# Patient Record
Sex: Female | Born: 2000 | Race: White | Hispanic: No | Marital: Single | Smoking: Never smoker
Health system: Southern US, Community
[De-identification: ages and names within clinical notes are randomized; demographics above are authoritative.]

## PROBLEM LIST (undated history)

## (undated) ENCOUNTER — Ambulatory Visit: Admission: EM

## (undated) DIAGNOSIS — E042 Nontoxic multinodular goiter: Secondary | ICD-10-CM

## (undated) HISTORY — PX: NO PAST SURGERIES: SHX2092

---
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
## 2021-03-30 ENCOUNTER — Ambulatory Visit: Payer: Self-pay | Admitting: Dermatology

## 2021-09-02 ENCOUNTER — Other Ambulatory Visit: Payer: Self-pay | Admitting: Obstetrics and Gynecology

## 2021-09-02 DIAGNOSIS — L700 Acne vulgaris: Secondary | ICD-10-CM

## 2021-09-02 DIAGNOSIS — Z3041 Encounter for surveillance of contraceptive pills: Secondary | ICD-10-CM

## 2021-09-28 NOTE — Progress Notes (Signed)
PCP:  Marina Goodell, MD   Chief Complaint  Patient presents with   Gynecologic Exam    No concerns     HPI:      Ms. Savannah Mcbride is a 20 y.o. G0P0000 whose LMP was Patient's last menstrual period was 09/08/2021 (approximate)., presents today for [facility] f/u.  Her menses are regular every 28-30 days, lasting 5 days.  Dysmenorrhea mild, occurring first 1-2 days of flow. She does not have intermenstrual bleeding. Started on lomedia OCPs in 2018 for menometrorrhagia with sx improvement. Had persistent acne and changed to Yaz, doing well. On accutane now  Sex activity: currently sexually active--conception OCPs Last Pap: N/A due to age Hx of STDs: none  There is no FH of breast cancer. There is no FH of ovarian cancer.  Pt has now occas sharp, fleeting ROUQ pains since last yr. Sx improved with d/c'd caffeine use, but still happens occas. No masses, erythema, trauma.   Tobacco use: The patient denies current or previous tobacco use. Alcohol use: none No drug use.  Exercise: moderately active  She does get adequate calcium but not Vitamin D in her diet. Gardasil not done.   Past Medical History:  Diagnosis Date   Multiple thyroid nodules 10/2017    Past Surgical History:  Procedure Laterality Date   NO PAST SURGERIES      Family History  Problem Relation Age of Onset   AAA (abdominal aortic aneurysm) Maternal Grandfather 59   Colon cancer Other    Breast cancer Neg Hx    Ovarian cancer Neg Hx    Deep vein thrombosis Neg Hx     Social History   Socioeconomic History   Marital status: Single    Spouse name: Not on file   Number of children: Not on file   Years of education: Not on file   Highest education level: Not on file  Occupational History   Not on file  Tobacco Use   Smoking status: Never   Smokeless tobacco: Never  Vaping Use   Vaping Use: Never used  Substance and Sexual Activity   Alcohol use: No   Drug use: No   Sexual activity: Yes    Birth  control/protection: Pill, Condom  Other Topics Concern   Not on file  Social History Narrative   Not on file   Social Determinants of Health   Financial Resource Strain: Not on file  Food Insecurity: Not on file  Transportation Needs: Not on file  Physical Activity: Not on file  Stress: Not on file  Social Connections: Not on file  Intimate Partner Violence: Not on file     Current Outpatient Medications:    ibuprofen (ADVIL,MOTRIN) 200 MG tablet, Take by mouth., Disp: , Rfl:    drospirenone-ethinyl estradiol (YAZ) 3-0.02 MG tablet, Take 1 tablet by mouth daily., Disp: 84 tablet, Rfl: 3     ROS:  Review of Systems  Constitutional:  Negative for fatigue, fever and unexpected weight change.  Respiratory:  Negative for cough, shortness of breath and wheezing.   Cardiovascular:  Negative for chest pain, palpitations and leg swelling.  Gastrointestinal:  Negative for blood in stool, constipation, diarrhea, nausea and vomiting.  Endocrine: Negative for cold intolerance, heat intolerance and polyuria.  Genitourinary:  Negative for dyspareunia, dysuria, flank pain, frequency, genital sores, hematuria, menstrual problem, pelvic pain, urgency, vaginal bleeding, vaginal discharge and vaginal pain.  Musculoskeletal:  Negative for back pain, joint swelling and myalgias.  Skin:  Negative  for rash.  Neurological:  Negative for dizziness, syncope, light-headedness, numbness and headaches.  Hematological:  Negative for adenopathy.  Psychiatric/Behavioral:  Negative for agitation, confusion, sleep disturbance and suicidal ideas. The patient is not nervous/anxious.   BREAST:  pain   Objective: BP 100/70   Ht 5\' 6"  (1.676 m)   Wt 160 lb (72.6 kg)   LMP 09/08/2021 (Approximate)   BMI 25.82 kg/m    Physical Exam Constitutional:      Appearance: She is well-developed.  Genitourinary:     Vulva normal.     Right Labia: No rash, tenderness or lesions.    Left Labia: No tenderness,  lesions or rash.    No vaginal discharge, erythema or tenderness.      Right Adnexa: not tender and no mass present.    Left Adnexa: not tender and no mass present.    No cervical friability or polyp.     Uterus is not enlarged or tender.  Breasts:    Right: No mass, nipple discharge, skin change or tenderness.     Left: No mass, nipple discharge, skin change or tenderness.  Neck:     Thyroid: No thyromegaly.  Cardiovascular:     Rate and Rhythm: Normal rate and regular rhythm.     Heart sounds: Normal heart sounds. No murmur heard. Pulmonary:     Effort: Pulmonary effort is normal.     Breath sounds: Normal breath sounds.  Abdominal:     Palpations: Abdomen is soft.     Tenderness: There is no abdominal tenderness. There is no guarding or rebound.  Musculoskeletal:        General: Normal range of motion.     Cervical back: Normal range of motion.  Lymphadenopathy:     Cervical: No cervical adenopathy.  Neurological:     General: No focal deficit present.     Mental Status: She is alert and oriented to person, place, and time.     Cranial Nerves: No cranial nerve deficit.  Skin:    General: Skin is warm and dry.  Psychiatric:        Mood and Affect: Mood normal.        Behavior: Behavior normal.        Thought Content: Thought content normal.        Judgment: Judgment normal.  Vitals reviewed.    Assessment/Plan: Encounter for annual routine gynecological examination  Screening for STD (sexually transmitted disease) - Plan: Cervicovaginal ancillary only  Encounter for surveillance of contraceptive pills - Plan: drospirenone-ethinyl estradiol (YAZ) 3-0.02 MG tablet; OCP RF  Acne vulgaris - Plan: drospirenone-ethinyl estradiol (YAZ) 3-0.02 MG tablet   Meds ordered this encounter  Medications   drospirenone-ethinyl estradiol (YAZ) 3-0.02 MG tablet    Sig: Take 1 tablet by mouth daily.    Dispense:  84 tablet    Refill:  3    Order Specific Question:   Supervising  Provider    Answer:   Gae Dry [id number]              GYN counsel adequate intake of calcium and vitamin D, diet and exercise     F/U  Return in about 1 year (around 09/29/2022).  Deziree Mokry B. Jasmina Gendron, PA-C 09/29/2021 11:25 AM

## 2021-09-29 ENCOUNTER — Encounter: Payer: Self-pay | Admitting: Obstetrics and Gynecology

## 2021-09-29 ENCOUNTER — Other Ambulatory Visit: Payer: Self-pay

## 2021-09-29 ENCOUNTER — Ambulatory Visit (INDEPENDENT_AMBULATORY_CARE_PROVIDER_SITE_OTHER): Payer: 59 | Admitting: Obstetrics and Gynecology

## 2021-09-29 ENCOUNTER — Other Ambulatory Visit (HOSPITAL_COMMUNITY)
Admission: RE | Admit: 2021-09-29 | Discharge: 2021-09-29 | Disposition: A | Discharge status: Home / Self Care | Payer: 59 | Source: Ambulatory Visit | Attending: Obstetrics and Gynecology | Admitting: Obstetrics and Gynecology

## 2021-09-29 VITALS — BP 100/70 | Ht 66.0 in | Wt 160.0 lb

## 2021-09-29 DIAGNOSIS — L700 Acne vulgaris: Secondary | ICD-10-CM | POA: Diagnosis not present

## 2021-09-29 DIAGNOSIS — Z01419 Encounter for gynecological examination (general) (routine) without abnormal findings: Secondary | ICD-10-CM

## 2021-09-29 DIAGNOSIS — Z113 Encounter for screening for infections with a predominantly sexual mode of transmission: Secondary | ICD-10-CM | POA: Diagnosis present

## 2021-09-29 DIAGNOSIS — Z3041 Encounter for surveillance of contraceptive pills: Secondary | ICD-10-CM | POA: Diagnosis not present

## 2021-09-29 MED ORDER — DROSPIRENONE-ETHINYL ESTRADIOL 3-0.02 MG PO TABS: 1.0000 | ORAL_TABLET | Freq: Every day | ORAL | 3 refills | Status: DC

## 2021-09-29 NOTE — Patient Instructions (Signed)
I value your feedback and you entrusting us with your care. If you get a [facility] patient survey, I would appreciate you taking the time to let us know about your experience today. Thank you! ? ? ?

## 2021-10-01 LAB — CERVICOVAGINAL ANCILLARY ONLY
Chlamydia: NEGATIVE
Comment: NEGATIVE
Comment: NORMAL
Neisseria Gonorrhea: NEGATIVE

## 2021-12-25 ENCOUNTER — Other Ambulatory Visit: Payer: Self-pay | Admitting: Obstetrics and Gynecology

## 2021-12-25 DIAGNOSIS — L700 Acne vulgaris: Secondary | ICD-10-CM

## 2021-12-25 DIAGNOSIS — Z3041 Encounter for surveillance of contraceptive pills: Secondary | ICD-10-CM

## 2021-12-30 ENCOUNTER — Telehealth: Payer: Self-pay

## 2021-12-30 NOTE — Telephone Encounter (Signed)
Patient requesting return call from [facility] nurse. No other details given. Cb#[phone]

## 2021-12-30 NOTE — Telephone Encounter (Signed)
Spoke w/patient. She states she just switched pharmacies and is requesting her rx be sent to [facility]. Reviewed meds. Full year rx for Yaz sent Friday 12/25/2021 to [place] patient to contact [facility] and request a rx transfer from [facility] for rx on file.

## 2022-07-15 DIAGNOSIS — L700 Acne vulgaris: Secondary | ICD-10-CM | POA: Diagnosis not present

## 2022-10-20 NOTE — Progress Notes (Unsigned)
PCP:  Sofie Hartigan, MD   No chief complaint on file.    HPI:      Ms. Savannah Mcbride is a 21 y.o. G0P0000 whose LMP was No LMP recorded., presents today for [facility] f/u.  Her menses are regular every 28-30 days, lasting 5 days.  Dysmenorrhea mild, occurring first 1-2 days of flow. She does not have intermenstrual bleeding. Started on lomedia OCPs in 2018 for menometrorrhagia with sx improvement. Had persistent acne and changed to Yaz, doing well. On accutane now  Sex activity: currently sexually active--conception OCPs Last Pap: N/A due to age Hx of STDs: none  There is no FH of breast cancer. There is no FH of ovarian cancer.  Pt has now occas sharp, fleeting ROUQ pains since last yr. Sx improved with d/c'd caffeine use, but still happens occas. No masses, erythema, trauma.   Tobacco use: The patient denies current or previous tobacco use. Alcohol use: none No drug use.  Exercise: moderately active  She does get adequate calcium but not Vitamin D in her diet. Gardasil not done.   Past Medical History:  Diagnosis Date   Multiple thyroid nodules 10/2017    Past Surgical History:  Procedure Laterality Date   NO PAST SURGERIES      Family History  Problem Relation Age of Onset   AAA (abdominal aortic aneurysm) Maternal Grandfather 59   Colon cancer Other    Breast cancer Neg Hx    Ovarian cancer Neg Hx    Deep vein thrombosis Neg Hx     Social History   Socioeconomic History   Marital status: Single    Spouse name: Not on file   Number of children: Not on file   Years of education: Not on file   Highest education level: Not on file  Occupational History   Not on file  Tobacco Use   Smoking status: Never   Smokeless tobacco: Never  Vaping Use   Vaping Use: Never used  Substance and Sexual Activity   Alcohol use: No   Drug use: No   Sexual activity: Yes    Birth control/protection: Pill, Condom  Other Topics Concern   Not on file  Social History  Narrative   Not on file   Social Determinants of Health   Financial Resource Strain: Not on file  Food Insecurity: Not on file  Transportation Needs: Not on file  Physical Activity: Not on file  Stress: Not on file  Social Connections: Not on file  Intimate Partner Violence: Not on file     Current Outpatient Medications:    drospirenone-ethinyl estradiol (YAZ) 3-0.02 MG tablet, TAKE 1 TABLET BY MOUTH DAILY, Disp: 84 tablet, Rfl: 3   ibuprofen (ADVIL,MOTRIN) 200 MG tablet, Take by mouth., Disp: , Rfl:      ROS:  Review of Systems  Constitutional:  Negative for fatigue, fever and unexpected weight change.  Respiratory:  Negative for cough, shortness of breath and wheezing.   Cardiovascular:  Negative for chest pain, palpitations and leg swelling.  Gastrointestinal:  Negative for blood in stool, constipation, diarrhea, nausea and vomiting.  Endocrine: Negative for cold intolerance, heat intolerance and polyuria.  Genitourinary:  Negative for dyspareunia, dysuria, flank pain, frequency, genital sores, hematuria, menstrual problem, pelvic pain, urgency, vaginal bleeding, vaginal discharge and vaginal pain.  Musculoskeletal:  Negative for back pain, joint swelling and myalgias.  Skin:  Negative for rash.  Neurological:  Negative for dizziness, syncope, light-headedness, numbness and headaches.  Hematological:  Negative for adenopathy.  Psychiatric/Behavioral:  Negative for agitation, confusion, sleep disturbance and suicidal ideas. The patient is not nervous/anxious.    BREAST:  pain   Objective: There were no vitals taken for this visit.   Physical Exam Constitutional:      Appearance: She is well-developed.  Genitourinary:     Vulva normal.     Right Labia: No rash, tenderness or lesions.    Left Labia: No tenderness, lesions or rash.    No vaginal discharge, erythema or tenderness.      Right Adnexa: not tender and no mass present.    Left Adnexa: not tender and no  mass present.    No cervical friability or polyp.     Uterus is not enlarged or tender.  Breasts:    Right: No mass, nipple discharge, skin change or tenderness.     Left: No mass, nipple discharge, skin change or tenderness.  Neck:     Thyroid: No thyromegaly.  Cardiovascular:     Rate and Rhythm: Normal rate and regular rhythm.     Heart sounds: Normal heart sounds. No murmur heard. Pulmonary:     Effort: Pulmonary effort is normal.     Breath sounds: Normal breath sounds.  Abdominal:     Palpations: Abdomen is soft.     Tenderness: There is no abdominal tenderness. There is no guarding or rebound.  Musculoskeletal:        General: Normal range of motion.     Cervical back: Normal range of motion.  Lymphadenopathy:     Cervical: No cervical adenopathy.  Neurological:     General: No focal deficit present.     Mental Status: She is alert and oriented to person, place, and time.     Cranial Nerves: No cranial nerve deficit.  Skin:    General: Skin is warm and dry.  Psychiatric:        Mood and Affect: Mood normal.        Behavior: Behavior normal.        Thought Content: Thought content normal.        Judgment: Judgment normal.  Vitals reviewed.     Assessment/Plan: Encounter for annual routine gynecological examination  Screening for STD (sexually transmitted disease) - Plan: Cervicovaginal ancillary only  Encounter for surveillance of contraceptive pills - Plan: drospirenone-ethinyl estradiol (YAZ) 3-0.02 MG tablet; OCP RF  Acne vulgaris - Plan: drospirenone-ethinyl estradiol (YAZ) 3-0.02 MG tablet   No orders of the defined types were placed in this encounter.             GYN counsel adequate intake of calcium and vitamin D, diet and exercise     F/U  No follow-ups on file.  Luca Burston B. Kalkidan Caudell, PA-C 10/20/2022 7:21 PM

## 2022-10-21 ENCOUNTER — Ambulatory Visit (INDEPENDENT_AMBULATORY_CARE_PROVIDER_SITE_OTHER): Payer: 59 | Admitting: Obstetrics and Gynecology

## 2022-10-21 ENCOUNTER — Other Ambulatory Visit (HOSPITAL_COMMUNITY)
Admission: RE | Admit: 2022-10-21 | Discharge: 2022-10-21 | Disposition: A | Discharge status: Home / Self Care | Payer: 59 | Source: Ambulatory Visit | Attending: Obstetrics and Gynecology | Admitting: Obstetrics and Gynecology

## 2022-10-21 ENCOUNTER — Encounter: Payer: Self-pay | Admitting: Obstetrics and Gynecology

## 2022-10-21 VITALS — BP 116/70 | Ht 66.0 in | Wt 160.0 lb

## 2022-10-21 DIAGNOSIS — R69 Illness, unspecified: Secondary | ICD-10-CM | POA: Diagnosis not present

## 2022-10-21 DIAGNOSIS — Z113 Encounter for screening for infections with a predominantly sexual mode of transmission: Secondary | ICD-10-CM | POA: Diagnosis not present

## 2022-10-21 DIAGNOSIS — Z01419 Encounter for gynecological examination (general) (routine) without abnormal findings: Secondary | ICD-10-CM | POA: Diagnosis not present

## 2022-10-21 DIAGNOSIS — N644 Mastodynia: Secondary | ICD-10-CM

## 2022-10-21 DIAGNOSIS — Z3041 Encounter for surveillance of contraceptive pills: Secondary | ICD-10-CM

## 2022-10-21 MED ORDER — DROSPIRENONE-ETHINYL ESTRADIOL 3-0.02 MG PO TABS: 1.0000 | ORAL_TABLET | Freq: Every day | ORAL | 3 refills | Status: DC

## 2022-10-21 NOTE — Patient Instructions (Signed)
I value your feedback and you entrusting us with your care. If you get a [facility] patient survey, I would appreciate you taking the time to let us know about your experience today. Thank you!  [facility]: [phone]      

## 2022-10-25 LAB — CERVICOVAGINAL ANCILLARY ONLY
Chlamydia: NEGATIVE
Comment: NEGATIVE
Comment: NORMAL
Neisseria Gonorrhea: NEGATIVE

## 2023-01-27 DIAGNOSIS — R52 Pain, unspecified: Secondary | ICD-10-CM | POA: Diagnosis not present

## 2023-01-27 DIAGNOSIS — R509 Fever, unspecified: Secondary | ICD-10-CM | POA: Diagnosis not present

## 2023-01-27 DIAGNOSIS — J101 Influenza due to other identified influenza virus with other respiratory manifestations: Secondary | ICD-10-CM | POA: Diagnosis not present

## 2023-01-27 DIAGNOSIS — R0981 Nasal congestion: Secondary | ICD-10-CM | POA: Diagnosis not present

## 2023-04-04 DIAGNOSIS — Z0000 Encounter for general adult medical examination without abnormal findings: Secondary | ICD-10-CM | POA: Diagnosis not present

## 2023-04-04 DIAGNOSIS — R197 Diarrhea, unspecified: Secondary | ICD-10-CM | POA: Diagnosis not present

## 2023-04-04 DIAGNOSIS — R252 Cramp and spasm: Secondary | ICD-10-CM | POA: Diagnosis not present

## 2023-05-06 ENCOUNTER — Telehealth: Payer: Self-pay

## 2023-05-06 MED ORDER — FLUCONAZOLE 150 MG PO TABS: 150.0000 mg | ORAL_TABLET | Freq: Once | ORAL | 0 refills | Status: AC

## 2023-05-06 NOTE — Telephone Encounter (Signed)
Patient contacted office stating that she had wisdom teeth taken out on Monday and was started on antibiotic, patient states that she developed a yeast infection from antibiotic and states that on Wednesday she took one dose of Diflucan. Patient states that symptoms of yeast are severe with discharge and itching now and dont seem to be improving, patient would like to know if our office can send her in a new prescription for Diflucan? Please advise. KW

## 2023-05-06 NOTE — Telephone Encounter (Signed)
Patient has been advised. KW 

## 2023-09-01 DIAGNOSIS — Z111 Encounter for screening for respiratory tuberculosis: Secondary | ICD-10-CM | POA: Diagnosis not present

## 2023-09-01 DIAGNOSIS — Z23 Encounter for immunization: Secondary | ICD-10-CM | POA: Diagnosis not present

## 2023-09-25 ENCOUNTER — Other Ambulatory Visit: Payer: Self-pay | Admitting: Obstetrics and Gynecology

## 2023-09-25 DIAGNOSIS — Z3041 Encounter for surveillance of contraceptive pills: Secondary | ICD-10-CM

## 2023-10-19 ENCOUNTER — Telehealth: Payer: Self-pay

## 2023-10-19 NOTE — Telephone Encounter (Signed)
Spoke with patient. She has AE scheduled for Thursday 10/27/23.  She advised she takes her last birth control pill on Sunday and will likely start her cycle Wed or Thursday. Inquiring if she needs to r/s. She will be moving to [employer] for school next month. She mentioned possibly doing continuous dosing to skip cycle and keep appointment. Advised will keep appointment as scheduled for now as I'm not sure when her next annual opening would be if she cancels. Will send message to Helmut Muster to review. Patient aware Helmut Muster will return to office on Monday afternoon.

## 2023-10-19 NOTE — Telephone Encounter (Signed)
[employer] VOICEMAIL: Patient has a question for [employer].

## 2023-10-22 NOTE — Telephone Encounter (Signed)
Ok for pt to still come for annual.

## 2023-10-25 NOTE — Telephone Encounter (Signed)
I contacted the patient via phone. She wants to keep this appointment as is right now . She will contact the office if she ends up needing to rescheduled.

## 2023-10-26 NOTE — Progress Notes (Unsigned)
PCP:  Marina Goodell, MD   No chief complaint on file.    HPI:      Ms. Savannah Mcbride is a 22 y.o. G0P0000 whose LMP was No LMP recorded., presents today for her annual exam.  Her menses are regular every 28-30 days, lasting 5 days.  Dysmenorrhea mild, occurring first 1-2 days of flow. She does not have intermenstrual bleeding. Started on lomedia OCPs in 2018 for menometrorrhagia with sx improvement. Had persistent acne and changed to Yaz, doing well.   Sex activity: currently sexually active--contraception OCPs/condoms. No pain/bleeding.  Last Pap: N/A due to age Hx of STDs: none  There is no FH of breast cancer. There is no FH of ovarian cancer.  Pt had intermittent RUOQ pains last yr that improved. Then developed sharp, fleeting RT breast pains more RLOQ daily/intermittent for the past 6 months. Had 1 day  of significant pain/ache a month ago, improved with ibup, and then pain resolved. No sx since. Never had masses/erythema. No trauma.   Tobacco use: The patient denies current or previous tobacco use. Alcohol use: none No drug use.  Exercise: moderately active  She does get adequate calcium and Vitamin D in her diet. Gardasil not done.   Past Medical History:  Diagnosis Date   Multiple thyroid nodules 10/2017    Past Surgical History:  Procedure Laterality Date   NO PAST SURGERIES      Family History  Problem Relation Age of Onset   AAA (abdominal aortic aneurysm) Maternal Grandfather 59   Colon cancer Other    Breast cancer Neg Hx    Ovarian cancer Neg Hx    Deep vein thrombosis Neg Hx     Social History   Socioeconomic History   Marital status: Single    Spouse name: Not on file   Number of children: Not on file   Years of education: Not on file   Highest education level: Not on file  Occupational History   Not on file  Tobacco Use   Smoking status: Never   Smokeless tobacco: Never  Vaping Use   Vaping status: Never Used  Substance and Sexual  Activity   Alcohol use: No   Drug use: No   Sexual activity: Yes    Birth control/protection: Pill, Condom  Other Topics Concern   Not on file  Social History Narrative   Not on file   Social Determinants of Health   Financial Resource Strain: Not on file  Food Insecurity: Not on file  Transportation Needs: Not on file  Physical Activity: Not on file  Stress: Not on file  Social Connections: Not on file  Intimate Partner Violence: Not on file     Current Outpatient Medications:    drospirenone-ethinyl estradiol (NIKKI) 3-0.02 MG tablet, TAKE 1 TABLET BY MOUTH EVERY DAY, Disp: 84 tablet, Rfl: 0     ROS:  Review of Systems  Constitutional:  Negative for fatigue, fever and unexpected weight change.  Respiratory:  Negative for cough, shortness of breath and wheezing.   Cardiovascular:  Negative for chest pain, palpitations and leg swelling.  Gastrointestinal:  Negative for blood in stool, constipation, diarrhea, nausea and vomiting.  Endocrine: Negative for cold intolerance, heat intolerance and polyuria.  Genitourinary:  Negative for dyspareunia, dysuria, flank pain, frequency, genital sores, hematuria, menstrual problem, pelvic pain, urgency, vaginal bleeding, vaginal discharge and vaginal pain.  Musculoskeletal:  Negative for back pain, joint swelling and myalgias.  Skin:  Negative for rash.  Neurological:  Negative for dizziness, syncope, light-headedness, numbness and headaches.  Hematological:  Negative for adenopathy.  Psychiatric/Behavioral:  Negative for agitation, confusion, sleep disturbance and suicidal ideas. The patient is not nervous/anxious.    BREAST:  pain   Objective: There were no vitals taken for this visit.   Physical Exam Constitutional:      Appearance: She is well-developed.  Genitourinary:     Vulva normal.     Right Labia: No rash, tenderness or lesions.    Left Labia: No tenderness, lesions or rash.    No vaginal discharge, erythema or  tenderness.      Right Adnexa: not tender and no mass present.    Left Adnexa: not tender and no mass present.    No cervical friability or polyp.     Uterus is not enlarged or tender.  Breasts:    Right: No mass, nipple discharge, skin change or tenderness.     Left: No mass, nipple discharge, skin change or tenderness.  Neck:     Thyroid: No thyromegaly.  Cardiovascular:     Rate and Rhythm: Normal rate and regular rhythm.     Heart sounds: Normal heart sounds. No murmur heard. Pulmonary:     Effort: Pulmonary effort is normal.     Breath sounds: Normal breath sounds.  Chest:    Abdominal:     Palpations: Abdomen is soft.     Tenderness: There is no abdominal tenderness. There is no guarding or rebound.  Musculoskeletal:        General: Normal range of motion.     Cervical back: Normal range of motion.  Lymphadenopathy:     Cervical: No cervical adenopathy.  Neurological:     General: No focal deficit present.     Mental Status: She is alert and oriented to person, place, and time.     Cranial Nerves: No cranial nerve deficit.  Skin:    General: Skin is warm and dry.  Psychiatric:        Mood and Affect: Mood normal.        Behavior: Behavior normal.        Thought Content: Thought content normal.        Judgment: Judgment normal.  Vitals reviewed.     Assessment/Plan: Encounter for annual routine gynecological examination  Screening for STD (sexually transmitted disease) - Plan: Cervicovaginal ancillary only  Encounter for surveillance of contraceptive pills - Plan: drospirenone-ethinyl estradiol (YAZ) 3-0.02 MG tablet; OCP RF eRxd  Breast pain, right--sx resolved. Neg exam. F/u if sx recur for breast u/s.    No orders of the defined types were placed in this encounter.            GYN counsel adequate intake of calcium and vitamin D, diet and exercise     F/U  No follow-ups on file.  Kazi Reppond B. Tanaka Gillen, PA-C 10/26/2023 2:39 PM

## 2023-10-27 ENCOUNTER — Other Ambulatory Visit (HOSPITAL_COMMUNITY)
Admission: RE | Admit: 2023-10-27 | Discharge: 2023-10-27 | Disposition: A | Discharge status: Home / Self Care | Payer: 59 | Source: Ambulatory Visit | Attending: Obstetrics and Gynecology | Admitting: Obstetrics and Gynecology

## 2023-10-27 ENCOUNTER — Encounter: Payer: Self-pay | Admitting: Obstetrics and Gynecology

## 2023-10-27 ENCOUNTER — Ambulatory Visit (INDEPENDENT_AMBULATORY_CARE_PROVIDER_SITE_OTHER): Payer: 59 | Admitting: Obstetrics and Gynecology

## 2023-10-27 VITALS — BP 100/72 | Ht 66.0 in | Wt 164.0 lb

## 2023-10-27 DIAGNOSIS — Z3041 Encounter for surveillance of contraceptive pills: Secondary | ICD-10-CM

## 2023-10-27 DIAGNOSIS — Z124 Encounter for screening for malignant neoplasm of cervix: Secondary | ICD-10-CM | POA: Diagnosis not present

## 2023-10-27 DIAGNOSIS — Z113 Encounter for screening for infections with a predominantly sexual mode of transmission: Secondary | ICD-10-CM | POA: Insufficient documentation

## 2023-10-27 DIAGNOSIS — N644 Mastodynia: Secondary | ICD-10-CM

## 2023-10-27 DIAGNOSIS — Z01419 Encounter for gynecological examination (general) (routine) without abnormal findings: Secondary | ICD-10-CM | POA: Diagnosis not present

## 2023-10-27 MED ORDER — DROSPIRENONE-ETHINYL ESTRADIOL 3-0.02 MG PO TABS: 1.0000 | ORAL_TABLET | Freq: Every day | ORAL | 3 refills | Status: DC

## 2023-10-27 NOTE — Patient Instructions (Signed)
I value your feedback and you entrusting us with your care. If you get a [facility] patient survey, I would appreciate you taking the time to let us know about your experience today. Thank you! ? ? ?

## 2023-10-28 LAB — CYTOLOGY - PAP
Chlamydia: NEGATIVE
Comment: NEGATIVE
Comment: NORMAL
Diagnosis: NEGATIVE
Neisseria Gonorrhea: NEGATIVE

## 2023-11-03 ENCOUNTER — Ambulatory Visit
Admission: RE | Admit: 2023-11-03 | Discharge: 2023-11-03 | Disposition: A | Discharge status: Home / Self Care | Payer: 59 | Source: Ambulatory Visit | Attending: Obstetrics and Gynecology | Admitting: Obstetrics and Gynecology

## 2023-11-03 ENCOUNTER — Other Ambulatory Visit: Payer: Self-pay | Admitting: Obstetrics and Gynecology

## 2023-11-03 DIAGNOSIS — N63 Unspecified lump in unspecified breast: Secondary | ICD-10-CM

## 2023-11-03 DIAGNOSIS — N6311 Unspecified lump in the right breast, upper outer quadrant: Secondary | ICD-10-CM | POA: Diagnosis not present

## 2023-11-03 DIAGNOSIS — N6313 Unspecified lump in the right breast, lower outer quadrant: Secondary | ICD-10-CM | POA: Diagnosis not present

## 2023-11-03 DIAGNOSIS — R9230 Dense breasts, unspecified: Secondary | ICD-10-CM | POA: Diagnosis not present

## 2023-11-03 DIAGNOSIS — N644 Mastodynia: Secondary | ICD-10-CM | POA: Diagnosis not present

## 2024-04-04 DIAGNOSIS — N644 Mastodynia: Secondary | ICD-10-CM | POA: Diagnosis not present

## 2024-04-04 DIAGNOSIS — Z0000 Encounter for general adult medical examination without abnormal findings: Secondary | ICD-10-CM | POA: Diagnosis not present

## 2024-04-04 DIAGNOSIS — R000 Tachycardia, unspecified: Secondary | ICD-10-CM | POA: Diagnosis not present

## 2024-04-24 DIAGNOSIS — L821 Other seborrheic keratosis: Secondary | ICD-10-CM | POA: Diagnosis not present

## 2024-04-24 DIAGNOSIS — L700 Acne vulgaris: Secondary | ICD-10-CM | POA: Diagnosis not present

## 2024-04-24 DIAGNOSIS — L814 Other melanin hyperpigmentation: Secondary | ICD-10-CM | POA: Diagnosis not present

## 2024-04-24 DIAGNOSIS — D225 Melanocytic nevi of trunk: Secondary | ICD-10-CM | POA: Diagnosis not present

## 2024-04-27 DIAGNOSIS — Z0000 Encounter for general adult medical examination without abnormal findings: Secondary | ICD-10-CM | POA: Diagnosis not present

## 2024-04-27 DIAGNOSIS — R000 Tachycardia, unspecified: Secondary | ICD-10-CM | POA: Diagnosis not present

## 2024-04-30 DIAGNOSIS — R002 Palpitations: Secondary | ICD-10-CM | POA: Diagnosis not present

## 2024-04-30 DIAGNOSIS — R000 Tachycardia, unspecified: Secondary | ICD-10-CM | POA: Diagnosis not present

## 2024-05-03 DIAGNOSIS — J069 Acute upper respiratory infection, unspecified: Secondary | ICD-10-CM | POA: Diagnosis not present

## 2024-05-03 DIAGNOSIS — J029 Acute pharyngitis, unspecified: Secondary | ICD-10-CM | POA: Diagnosis not present

## 2024-05-08 ENCOUNTER — Ambulatory Visit
Admission: RE | Admit: 2024-05-08 | Discharge: 2024-05-08 | Disposition: A | Discharge status: Home / Self Care | Payer: 59 | Source: Ambulatory Visit | Attending: Obstetrics and Gynecology | Admitting: Obstetrics and Gynecology

## 2024-05-08 DIAGNOSIS — N63 Unspecified lump in unspecified breast: Secondary | ICD-10-CM | POA: Insufficient documentation

## 2024-05-08 DIAGNOSIS — N6313 Unspecified lump in the right breast, lower outer quadrant: Secondary | ICD-10-CM | POA: Diagnosis not present

## 2024-05-08 DIAGNOSIS — R928 Other abnormal and inconclusive findings on diagnostic imaging of breast: Secondary | ICD-10-CM | POA: Diagnosis not present

## 2024-05-17 DIAGNOSIS — R000 Tachycardia, unspecified: Secondary | ICD-10-CM | POA: Diagnosis not present

## 2024-05-28 DIAGNOSIS — R002 Palpitations: Secondary | ICD-10-CM | POA: Diagnosis not present

## 2024-05-28 DIAGNOSIS — R000 Tachycardia, unspecified: Secondary | ICD-10-CM | POA: Diagnosis not present

## 2024-05-28 DIAGNOSIS — R42 Dizziness and giddiness: Secondary | ICD-10-CM | POA: Diagnosis not present

## 2024-06-18 DIAGNOSIS — R42 Dizziness and giddiness: Secondary | ICD-10-CM | POA: Diagnosis not present

## 2024-06-18 DIAGNOSIS — R000 Tachycardia, unspecified: Secondary | ICD-10-CM | POA: Diagnosis not present

## 2024-06-18 DIAGNOSIS — R002 Palpitations: Secondary | ICD-10-CM | POA: Diagnosis not present

## 2024-07-07 DIAGNOSIS — L989 Disorder of the skin and subcutaneous tissue, unspecified: Secondary | ICD-10-CM | POA: Diagnosis not present

## 2024-07-07 DIAGNOSIS — B379 Candidiasis, unspecified: Secondary | ICD-10-CM | POA: Diagnosis not present

## 2024-07-07 DIAGNOSIS — B9689 Other specified bacterial agents as the cause of diseases classified elsewhere: Secondary | ICD-10-CM | POA: Diagnosis not present

## 2024-07-07 DIAGNOSIS — L089 Local infection of the skin and subcutaneous tissue, unspecified: Secondary | ICD-10-CM | POA: Diagnosis not present

## 2024-07-07 DIAGNOSIS — T3695XA Adverse effect of unspecified systemic antibiotic, initial encounter: Secondary | ICD-10-CM | POA: Diagnosis not present

## 2024-09-02 ENCOUNTER — Other Ambulatory Visit: Payer: Self-pay | Admitting: Obstetrics and Gynecology

## 2024-09-02 DIAGNOSIS — Z3041 Encounter for surveillance of contraceptive pills: Secondary | ICD-10-CM

## 2024-09-03 ENCOUNTER — Other Ambulatory Visit: Payer: Self-pay

## 2024-09-03 DIAGNOSIS — Z3041 Encounter for surveillance of contraceptive pills: Secondary | ICD-10-CM

## 2024-09-03 MED ORDER — DROSPIRENONE-ETHINYL ESTRADIOL 3-0.02 MG PO TABS: 1.0000 | ORAL_TABLET | Freq: Every day | ORAL | 0 refills | Status: DC

## 2024-09-03 NOTE — Progress Notes (Signed)
 Although refills remain, the rx expires 10/2024 so pharmacy will not fill last 3 months.  3 month supply authorized.

## 2024-10-29 ENCOUNTER — Ambulatory Visit: Admitting: Obstetrics and Gynecology

## 2024-11-23 ENCOUNTER — Ambulatory Visit: Admitting: Obstetrics

## 2024-11-26 NOTE — Progress Notes (Unsigned)
 "  PCP:  Jeffie Cheryl BRAVO, MD   No chief complaint on file.    HPI:      Ms. Savannah Mcbride is a 24 y.o. G0P0000 whose LMP was No LMP recorded., presents today for her annual exam.  Her menses are regular every 28-30 days, lasting 5-6 days of real flow, then 5-6 days spotting only after period for the past 2 months on OCPs; unusual for pt.  Dysmenorrhea mild, occurring first 1-2 days of flow. Started on lomedia OCPs in 2018 for menometrorrhagia with sx improvement. Had persistent acne and changed to Yaz, doing well.   Sex activity: currently sexually active--contraception OCPs/condoms. No pain/bleeding.  Last Pap: 10/27/23 was NILM Hx of STDs: none  There is no FH of breast cancer. There is no FH of ovarian cancer.  Pt had intermittent RUOQ pains last yr that improved;  have since recurred since last yr. Sx intermittent, about once wkly, no aggrav factors. Sharp pain in 1 area, no masses. Relieved with ibup if needed. Drinks 1-2 cups caffeine daily. Has not had breast u/s. Does not do SBE.  Tobacco use: The patient denies current or previous tobacco use. Alcohol use: none No drug use.  Exercise: moderately active  She does get adequate calcium and occas Vitamin D in her diet. Gardasil not done.   Past Medical History:  Diagnosis Date   Multiple thyroid  nodules 10/2017    Past Surgical History:  Procedure Laterality Date   NO PAST SURGERIES      Family History  Problem Relation Age of Onset   AAA (abdominal aortic aneurysm) Maternal Grandfather 59   Colon cancer Other    Breast cancer Neg Hx    Ovarian cancer Neg Hx    Deep vein thrombosis Neg Hx     Social History   Socioeconomic History   Marital status: Single    Spouse name: Not on file   Number of children: Not on file   Years of education: Not on file   Highest education level: Not on file  Occupational History   Not on file  Tobacco Use   Smoking status: Never   Smokeless tobacco: Never  Vaping Use    Vaping status: Never Used  Substance and Sexual Activity   Alcohol use: Yes    Comment: socially   Drug use: No   Sexual activity: Yes    Birth control/protection: Pill, Condom  Other Topics Concern   Not on file  Social History Narrative   Not on file   Social Drivers of Health   Tobacco Use: Low Risk  (05/28/2024)   Received from [facility]   Patient History    Smoking Tobacco Use: Never    Smokeless Tobacco Use: Never    Passive Exposure: Never  Financial Resource Strain: Low Risk  (04/30/2024)   Received from [facility]   Overall Financial Resource Strain (CARDIA)    Difficulty of Paying Living Expenses: Not hard at all  Food Insecurity: No Food Insecurity (04/30/2024)   Received from [facility]   Epic    Within the past 12 months, you worried that your food would run out before you got the money to buy more.: Never true    Within the past 12 months, the food you bought just didn't last and you didn't have money to get more.: Never true  Transportation Needs: No Transportation Needs (04/30/2024)   Received from [facility]  PRAPARE - Transportation    In the past 12 months, has lack of transportation kept you from medical appointments or from getting medications?: No    Lack of Transportation (Non-Medical): No  Physical Activity: Not on file  Stress: Not on file  Social Connections: Not on file  Intimate Partner Violence: Not on file  Depression (EYV7-0): Not on file  Alcohol Screen: Not on file  Housing: Low Risk  (04/30/2024)   Received from [facility]   Epic    In the last 12 months, was there a time when you were not able to pay the mortgage or rent on time?: No    In the past 12 months, how many times have you moved where you were living?: 0    At any time in the past 12 months, were you homeless or living in a shelter (including now)?: No  Utilities: Not At Risk (04/30/2024)    Received from [facility]   Epic    In the past 12 months has the electric, gas, oil, or water company threatened to shut off services in your home?: No  Health Literacy: Not on file     Current Outpatient Medications:    drospirenone -ethinyl estradiol  (NIKKI ) 3-0.02 MG tablet, Take 1 tablet by mouth daily., Disp: 84 tablet, Rfl: 0     ROS:  Review of Systems  Constitutional:  Negative for fatigue, fever and unexpected weight change.  Respiratory:  Negative for cough, shortness of breath and wheezing.   Cardiovascular:  Negative for chest pain, palpitations and leg swelling.  Gastrointestinal:  Negative for blood in stool, constipation, diarrhea, nausea and vomiting.  Endocrine: Negative for cold intolerance, heat intolerance and polyuria.  Genitourinary:  Positive for vaginal bleeding. Negative for dyspareunia, dysuria, flank pain, frequency, genital sores, hematuria, menstrual problem, pelvic pain, urgency, vaginal discharge and vaginal pain.  Musculoskeletal:  Negative for back pain, joint swelling and myalgias.  Skin:  Negative for rash.  Neurological:  Negative for dizziness, syncope, light-headedness, numbness and headaches.  Hematological:  Negative for adenopathy.  Psychiatric/Behavioral:  Negative for agitation, confusion, sleep disturbance and suicidal ideas. The patient is not nervous/anxious.    BREAST:  pain   Objective: There were no vitals taken for this visit.   Physical Exam Constitutional:      Appearance: She is well-developed.  Genitourinary:     Vulva normal.     Right Labia: No rash, tenderness or lesions.    Left Labia: No tenderness, lesions or rash.    No vaginal discharge, erythema or tenderness.      Right Adnexa: not tender and no mass present.    Left Adnexa: not tender and no mass present.    No cervical friability or polyp.     Uterus is not enlarged or tender.  Breasts:    Right: Tenderness present. No mass, nipple  discharge or skin change.     Left: No mass, nipple discharge, skin change or tenderness.  Neck:     Thyroid : No thyromegaly.  Cardiovascular:     Rate and Rhythm: Normal rate and regular rhythm.     Heart sounds: Normal heart sounds. No murmur heard. Pulmonary:     Effort: Pulmonary effort is normal.     Breath sounds: Normal breath sounds.  Chest:    Abdominal:     Palpations: Abdomen is soft.     Tenderness: There is no abdominal tenderness. There is no guarding or rebound.  Musculoskeletal:  General: Normal range of motion.     Cervical back: Normal range of motion.  Lymphadenopathy:     Cervical: No cervical adenopathy.  Neurological:     General: No focal deficit present.     Mental Status: She is alert and oriented to person, place, and time.     Cranial Nerves: No cranial nerve deficit.  Skin:    General: Skin is warm and dry.  Psychiatric:        Mood and Affect: Mood normal.        Behavior: Behavior normal.        Thought Content: Thought content normal.        Judgment: Judgment normal.  Vitals reviewed.     Assessment/Plan: Encounter for annual routine gynecological examination  Cervical cancer screening - Plan: Cytology - PAP  Screening for STD (sexually transmitted disease) - Plan: Cytology - PAP  Encounter for surveillance of contraceptive pills - Plan: drospirenone -ethinyl estradiol  (NIKKI ) 3-0.02 MG tablet; Rx RF eRxd. Pt to f/u prn spotting after menses for 2 months. If cont, will change OCPs.   Breast pain, right - Plan: US  LIMITED ULTRASOUND INCLUDING AXILLA RIGHT BREAST; pt to schedule u/s, will f/u with results. Limit caffeine. If u/s neg, will recommend evening primrose oil and Vit E supp.   No orders of the defined types were placed in this encounter.            GYN counsel adequate intake of calcium and vitamin D, diet and exercise     F/U  No follow-ups on file.  Mozetta Murfin B. Chaney Maclaren, PA-C 11/26/2024 4:28 PM "

## 2024-11-28 ENCOUNTER — Encounter: Payer: Self-pay | Admitting: Obstetrics and Gynecology

## 2024-11-28 ENCOUNTER — Ambulatory Visit: Admitting: Obstetrics and Gynecology

## 2024-11-28 ENCOUNTER — Other Ambulatory Visit (HOSPITAL_COMMUNITY)
Admission: RE | Admit: 2024-11-28 | Discharge: 2024-11-28 | Disposition: A | Discharge status: Home / Self Care | Source: Ambulatory Visit | Attending: Obstetrics and Gynecology | Admitting: Obstetrics and Gynecology

## 2024-11-28 VITALS — BP 113/75 | HR 137 | Ht 66.0 in | Wt 167.0 lb

## 2024-11-28 DIAGNOSIS — N644 Mastodynia: Secondary | ICD-10-CM | POA: Diagnosis not present

## 2024-11-28 DIAGNOSIS — Z113 Encounter for screening for infections with a predominantly sexual mode of transmission: Secondary | ICD-10-CM

## 2024-11-28 DIAGNOSIS — Z3041 Encounter for surveillance of contraceptive pills: Secondary | ICD-10-CM

## 2024-11-28 DIAGNOSIS — Z01411 Encounter for gynecological examination (general) (routine) with abnormal findings: Secondary | ICD-10-CM | POA: Diagnosis not present

## 2024-11-28 DIAGNOSIS — Z01419 Encounter for gynecological examination (general) (routine) without abnormal findings: Secondary | ICD-10-CM

## 2024-11-28 MED ORDER — DROSPIRENONE-ETHINYL ESTRADIOL 3-0.02 MG PO TABS: 1.0000 | ORAL_TABLET | Freq: Every day | ORAL | 3 refills | Status: AC

## 2024-11-28 NOTE — Patient Instructions (Signed)
 I value your feedback and you entrusting [place] with your care. If you get a [place] patient survey, I would appreciate you taking the time to let us know about your experience today. Thank you! ? ? ?

## 2024-11-29 LAB — CERVICOVAGINAL ANCILLARY ONLY
Chlamydia: NEGATIVE
Comment: NEGATIVE
Comment: NORMAL
Neisseria Gonorrhea: NEGATIVE

## 2024-12-03 ENCOUNTER — Other Ambulatory Visit: Payer: Self-pay | Admitting: Family Medicine

## 2024-12-03 DIAGNOSIS — Z113 Encounter for screening for infections with a predominantly sexual mode of transmission: Secondary | ICD-10-CM

## 2024-12-03 DIAGNOSIS — Z01419 Encounter for gynecological examination (general) (routine) without abnormal findings: Secondary | ICD-10-CM

## 2024-12-03 DIAGNOSIS — Z3041 Encounter for surveillance of contraceptive pills: Secondary | ICD-10-CM

## 2024-12-03 DIAGNOSIS — N644 Mastodynia: Secondary | ICD-10-CM

## 2024-12-03 DIAGNOSIS — N6310 Unspecified lump in the right breast, unspecified quadrant: Secondary | ICD-10-CM

## 2025-01-23 ENCOUNTER — Other Ambulatory Visit
